# Patient Record
Sex: Female | Born: 1935 | Race: Asian | Hispanic: No | Smoking: Never smoker
Health system: Southern US, Community
[De-identification: ages and names within clinical notes are randomized; demographics above are authoritative.]

## PROBLEM LIST (undated history)

## (undated) DIAGNOSIS — I10 Essential (primary) hypertension: Secondary | ICD-10-CM

## (undated) HISTORY — PX: APPENDECTOMY: SHX54

---

## 2012-07-09 ENCOUNTER — Other Ambulatory Visit (HOSPITAL_COMMUNITY): Payer: Self-pay | Admitting: Nurse Practitioner

## 2012-07-09 DIAGNOSIS — N289 Disorder of kidney and ureter, unspecified: Secondary | ICD-10-CM

## 2012-07-11 ENCOUNTER — Ambulatory Visit (HOSPITAL_COMMUNITY)
Admission: RE | Admit: 2012-07-11 | Discharge: 2012-07-11 | Disposition: A | Discharge status: Home / Self Care | Payer: Medicare Other | Source: Ambulatory Visit | Attending: Nurse Practitioner | Admitting: Nurse Practitioner

## 2012-07-11 DIAGNOSIS — Q619 Cystic kidney disease, unspecified: Secondary | ICD-10-CM | POA: Insufficient documentation

## 2012-07-11 DIAGNOSIS — N289 Disorder of kidney and ureter, unspecified: Secondary | ICD-10-CM

## 2012-07-11 DIAGNOSIS — N189 Chronic kidney disease, unspecified: Secondary | ICD-10-CM | POA: Insufficient documentation

## 2013-11-12 ENCOUNTER — Other Ambulatory Visit (HOSPITAL_COMMUNITY): Payer: Self-pay | Admitting: Internal Medicine

## 2013-11-12 DIAGNOSIS — Z139 Encounter for screening, unspecified: Secondary | ICD-10-CM

## 2013-11-18 ENCOUNTER — Ambulatory Visit (HOSPITAL_COMMUNITY)
Admission: RE | Admit: 2013-11-18 | Discharge: 2013-11-18 | Disposition: A | Discharge status: Home / Self Care | Payer: Medicare HMO | Source: Ambulatory Visit | Attending: Internal Medicine | Admitting: Internal Medicine

## 2013-11-18 DIAGNOSIS — Z139 Encounter for screening, unspecified: Secondary | ICD-10-CM | POA: Diagnosis not present

## 2014-02-20 ENCOUNTER — Ambulatory Visit: Payer: Self-pay | Admitting: Ophthalmology

## 2014-03-04 ENCOUNTER — Ambulatory Visit: Payer: Self-pay | Admitting: Ophthalmology

## 2014-03-25 ENCOUNTER — Ambulatory Visit: Payer: Self-pay | Admitting: Ophthalmology

## 2014-04-14 DIAGNOSIS — E875 Hyperkalemia: Secondary | ICD-10-CM | POA: Diagnosis not present

## 2014-04-14 DIAGNOSIS — D509 Iron deficiency anemia, unspecified: Secondary | ICD-10-CM | POA: Diagnosis not present

## 2014-04-30 DIAGNOSIS — Z0100 Encounter for examination of eyes and vision without abnormal findings: Secondary | ICD-10-CM | POA: Diagnosis not present

## 2014-06-23 DIAGNOSIS — H524 Presbyopia: Secondary | ICD-10-CM | POA: Diagnosis not present

## 2014-06-23 DIAGNOSIS — H5210 Myopia, unspecified eye: Secondary | ICD-10-CM | POA: Diagnosis not present

## 2014-07-26 NOTE — Op Note (Signed)
PATIENT NAME:  Phyllis Henderson, [name] MR#:  [id number] DATE OF BIRTH:  01-20-1932  DATE OF PROCEDURE:  03/04/2014  LOCATION: [facility]  PREOPERATIVE DIAGNOSIS:  Nuclear sclerotic cataract of the right eye.   POSTOPERATIVE DIAGNOSIS:  Nuclear sclerotic cataract of the right eye.   OPERATIVE PROCEDURE:  Cataract extraction by phacoemulsification with implant of intraocular lens to the right eye.   SURGEON:  Galen ManilaWilliam Kalika Smay, MD.   ANESTHESIA:  1. Managed anesthesia care.  2. Topical tetracaine drops followed by 2% Xylocaine jelly applied in the preoperative holding area.   COMPLICATIONS:  None.   TECHNIQUE:   Stop and chop.  DESCRIPTION OF PROCEDURE:  The patient was examined and consented in the preoperative holding area where the aforementioned topical anesthesia was applied to the right eye and then brought back to the Operating Room where the right eye was prepped and draped in the usual sterile ophthalmic fashion and a lid speculum was placed. A paracentesis was created with the side port blade and the anterior chamber was filled with viscoelastic. A near clear corneal incision was performed with the steel keratome. A continuous curvilinear capsulorrhexis was performed with a cystotome followed by the capsulorrhexis forceps. Hydrodissection and hydrodelineation were carried out with BSS on a blunt cannula. The lens was removed in a stop and chop technique and the remaining cortical material was removed with the irrigation-aspiration handpiece. The capsular bag was inflated with viscoelastic and the Tecnis ZCB00 21.0 diopter lens, serial number [id number] was placed in the capsular bag without complication. The remaining viscoelastic was removed from the eye with the irrigation-aspiration handpiece. The wounds were hydrated. The anterior chamber was flushed with Miostat and the eye was inflated to physiologic pressure. 0.1 mL of cefuroxime concentration 10 mg/mL was placed in the anterior chamber. The  wounds were found to be water tight. The eye was dressed with Vigamox.   The patient was given protective glasses to wear throughout the day and a shield with which to sleep tonight. The patient was also given drops with which to begin a drop regimen today and will follow-up with me in one day.    ____________________________ Jerilee FieldWilliam L. Rosario Duey, MD wlp:MT D: 03/04/2014 16:16:46 ET T: 03/04/2014 17:32:08 ET JOB#: [id number]  cc: Tom Macpherson L. Keinan Brouillet, MD, <Dictator> Jerilee FieldWILLIAM L Cyleigh Massaro MD ELECTRONICALLY SIGNED 03/05/2014 12:16

## 2014-07-26 NOTE — Op Note (Signed)
PATIENT NAME:  Phyllis Henderson, Phyllis Henderson MR#:  [id number] DATE OF BIRTH:  Jun 24, 1931  DATE OF PROCEDURE:  03/25/2014  PREOPERATIVE DIAGNOSIS:  Nuclear sclerotic cataract of the left eye.   POSTOPERATIVE DIAGNOSIS:  Nuclear sclerotic cataract of the left eye.   OPERATIVE PROCEDURE:  Cataract extraction by phacoemulsification with implant of intraocular lens to left eye.   SURGEON:  Galen ManilaWilliam Ladell Lea, MD.   ANESTHESIA:  1. Managed anesthesia care.  2. Topical tetracaine drops followed by 2% Xylocaine jelly applied in the preoperative holding area.   COMPLICATIONS:  None.   TECHNIQUE:   Stop and chop.  DESCRIPTION OF PROCEDURE:  The patient was examined and consented in the preoperative holding area where the aforementioned topical anesthesia was applied to the left eye and then brought back to the Operating Room where the left eye was prepped and draped in the usual sterile ophthalmic fashion and a lid speculum was placed. A paracentesis was created with the side port blade and the anterior chamber was filled with viscoelastic. A near clear corneal incision was performed with the steel keratome. A continuous curvilinear capsulorrhexis was performed with a cystotome followed by the capsulorrhexis forceps. Hydrodissection and hydrodelineation were carried out with BSS on a blunt cannula. The lens was removed in a stop and chop technique and the remaining cortical material was removed with the irrigation-aspiration handpiece. The capsular bag was inflated with viscoelastic and the Tecnis ZCB00 21.0-diopter lens, serial number [id number] was placed in the capsular bag without complication. The remaining viscoelastic was removed from the eye with the irrigation-aspiration handpiece. The wounds were hydrated. The anterior chamber was flushed with Miostat and the eye was inflated to physiologic pressure. 0.1 mL of cefuroxime concentration 10 mg/mL was placed in the anterior chamber. The wounds were found to be water  tight. The eye was dressed with Vigamox. The patient was given protective glasses to wear throughout the day and a shield with which to sleep tonight. The patient was also given drops with which to begin a drop regimen today and will follow-up with me in one day.     ____________________________ Jerilee FieldWilliam L. Berlynn Warsame, MD wlp:bm D: 03/25/2014 21:55:22 ET T: 03/25/2014 22:50:28 ET JOB#: [id number]  cc: Dennice Tindol L. Lareta Bruneau, MD, <Dictator> Jerilee FieldWILLIAM L Abas Leicht MD ELECTRONICALLY SIGNED 03/26/2014 14:41

## 2014-10-14 ENCOUNTER — Other Ambulatory Visit (HOSPITAL_COMMUNITY): Payer: Self-pay | Admitting: Internal Medicine

## 2014-10-14 DIAGNOSIS — E782 Mixed hyperlipidemia: Secondary | ICD-10-CM | POA: Diagnosis not present

## 2014-10-14 DIAGNOSIS — I10 Essential (primary) hypertension: Secondary | ICD-10-CM | POA: Diagnosis not present

## 2014-10-14 DIAGNOSIS — Z1231 Encounter for screening mammogram for malignant neoplasm of breast: Secondary | ICD-10-CM

## 2014-10-16 DIAGNOSIS — E782 Mixed hyperlipidemia: Secondary | ICD-10-CM | POA: Diagnosis not present

## 2014-10-16 DIAGNOSIS — M810 Age-related osteoporosis without current pathological fracture: Secondary | ICD-10-CM | POA: Diagnosis not present

## 2014-10-16 DIAGNOSIS — I10 Essential (primary) hypertension: Secondary | ICD-10-CM | POA: Diagnosis not present

## 2014-10-16 DIAGNOSIS — D509 Iron deficiency anemia, unspecified: Secondary | ICD-10-CM | POA: Diagnosis not present

## 2014-10-22 ENCOUNTER — Other Ambulatory Visit (HOSPITAL_COMMUNITY): Payer: Self-pay | Admitting: Internal Medicine

## 2014-10-22 DIAGNOSIS — M810 Age-related osteoporosis without current pathological fracture: Secondary | ICD-10-CM

## 2014-10-31 ENCOUNTER — Ambulatory Visit (HOSPITAL_COMMUNITY)
Admission: RE | Admit: 2014-10-31 | Discharge: 2014-10-31 | Disposition: A | Discharge status: Home / Self Care | Payer: Commercial Managed Care - HMO | Source: Ambulatory Visit | Attending: Internal Medicine | Admitting: Internal Medicine

## 2014-10-31 DIAGNOSIS — Z780 Asymptomatic menopausal state: Secondary | ICD-10-CM | POA: Insufficient documentation

## 2014-10-31 DIAGNOSIS — M810 Age-related osteoporosis without current pathological fracture: Secondary | ICD-10-CM

## 2014-10-31 DIAGNOSIS — M859 Disorder of bone density and structure, unspecified: Secondary | ICD-10-CM | POA: Diagnosis not present

## 2014-11-21 ENCOUNTER — Ambulatory Visit (HOSPITAL_COMMUNITY)
Admission: RE | Admit: 2014-11-21 | Discharge: 2014-11-21 | Disposition: A | Discharge status: Home / Self Care | Payer: Commercial Managed Care - HMO | Source: Ambulatory Visit | Attending: Internal Medicine | Admitting: Internal Medicine

## 2014-11-21 DIAGNOSIS — Z1231 Encounter for screening mammogram for malignant neoplasm of breast: Secondary | ICD-10-CM | POA: Insufficient documentation

## 2014-11-25 ENCOUNTER — Other Ambulatory Visit: Payer: Self-pay | Admitting: Internal Medicine

## 2014-11-25 DIAGNOSIS — R928 Other abnormal and inconclusive findings on diagnostic imaging of breast: Secondary | ICD-10-CM

## 2014-12-09 ENCOUNTER — Ambulatory Visit (HOSPITAL_COMMUNITY)
Admission: RE | Admit: 2014-12-09 | Discharge: 2014-12-09 | Disposition: A | Discharge status: Home / Self Care | Payer: Commercial Managed Care - HMO | Source: Ambulatory Visit | Attending: Internal Medicine | Admitting: Internal Medicine

## 2014-12-09 DIAGNOSIS — R928 Other abnormal and inconclusive findings on diagnostic imaging of breast: Secondary | ICD-10-CM | POA: Insufficient documentation

## 2015-03-24 DIAGNOSIS — E782 Mixed hyperlipidemia: Secondary | ICD-10-CM | POA: Diagnosis not present

## 2015-03-24 DIAGNOSIS — I10 Essential (primary) hypertension: Secondary | ICD-10-CM | POA: Diagnosis not present

## 2015-03-26 DIAGNOSIS — E782 Mixed hyperlipidemia: Secondary | ICD-10-CM | POA: Diagnosis not present

## 2015-03-26 DIAGNOSIS — I10 Essential (primary) hypertension: Secondary | ICD-10-CM | POA: Diagnosis not present

## 2015-03-26 DIAGNOSIS — D509 Iron deficiency anemia, unspecified: Secondary | ICD-10-CM | POA: Diagnosis not present

## 2015-03-26 DIAGNOSIS — M816 Localized osteoporosis [Lequesne]: Secondary | ICD-10-CM | POA: Diagnosis not present

## 2015-06-26 DIAGNOSIS — H524 Presbyopia: Secondary | ICD-10-CM | POA: Diagnosis not present

## 2015-06-26 DIAGNOSIS — H5210 Myopia, unspecified eye: Secondary | ICD-10-CM | POA: Diagnosis not present

## 2015-09-29 ENCOUNTER — Other Ambulatory Visit (HOSPITAL_COMMUNITY): Payer: Self-pay | Admitting: Internal Medicine

## 2015-09-29 DIAGNOSIS — E782 Mixed hyperlipidemia: Secondary | ICD-10-CM | POA: Diagnosis not present

## 2015-09-29 DIAGNOSIS — I10 Essential (primary) hypertension: Secondary | ICD-10-CM | POA: Diagnosis not present

## 2015-09-29 DIAGNOSIS — Z1231 Encounter for screening mammogram for malignant neoplasm of breast: Secondary | ICD-10-CM

## 2015-10-01 DIAGNOSIS — E875 Hyperkalemia: Secondary | ICD-10-CM | POA: Diagnosis not present

## 2015-10-01 DIAGNOSIS — I10 Essential (primary) hypertension: Secondary | ICD-10-CM | POA: Diagnosis not present

## 2015-10-01 DIAGNOSIS — D509 Iron deficiency anemia, unspecified: Secondary | ICD-10-CM | POA: Diagnosis not present

## 2015-10-01 DIAGNOSIS — D72829 Elevated white blood cell count, unspecified: Secondary | ICD-10-CM | POA: Diagnosis not present

## 2015-10-01 DIAGNOSIS — E782 Mixed hyperlipidemia: Secondary | ICD-10-CM | POA: Diagnosis not present

## 2015-11-27 ENCOUNTER — Ambulatory Visit (HOSPITAL_COMMUNITY)
Admission: RE | Admit: 2015-11-27 | Discharge: 2015-11-27 | Disposition: A | Discharge status: Home / Self Care | Payer: Commercial Managed Care - HMO | Source: Ambulatory Visit | Attending: Internal Medicine | Admitting: Internal Medicine

## 2015-11-27 DIAGNOSIS — Z1231 Encounter for screening mammogram for malignant neoplasm of breast: Secondary | ICD-10-CM | POA: Diagnosis not present

## 2016-03-22 DIAGNOSIS — E782 Mixed hyperlipidemia: Secondary | ICD-10-CM | POA: Diagnosis not present

## 2016-03-22 DIAGNOSIS — I10 Essential (primary) hypertension: Secondary | ICD-10-CM | POA: Diagnosis not present

## 2016-03-24 DIAGNOSIS — E875 Hyperkalemia: Secondary | ICD-10-CM | POA: Diagnosis not present

## 2016-03-24 DIAGNOSIS — E782 Mixed hyperlipidemia: Secondary | ICD-10-CM | POA: Diagnosis not present

## 2016-03-24 DIAGNOSIS — I10 Essential (primary) hypertension: Secondary | ICD-10-CM | POA: Diagnosis not present

## 2016-07-11 DIAGNOSIS — H5213 Myopia, bilateral: Secondary | ICD-10-CM | POA: Diagnosis not present

## 2016-07-11 DIAGNOSIS — H25013 Cortical age-related cataract, bilateral: Secondary | ICD-10-CM | POA: Diagnosis not present

## 2016-09-21 ENCOUNTER — Other Ambulatory Visit (HOSPITAL_COMMUNITY): Payer: Self-pay | Admitting: Internal Medicine

## 2016-09-21 DIAGNOSIS — I10 Essential (primary) hypertension: Secondary | ICD-10-CM | POA: Diagnosis not present

## 2016-09-21 DIAGNOSIS — D509 Iron deficiency anemia, unspecified: Secondary | ICD-10-CM | POA: Diagnosis not present

## 2016-09-21 DIAGNOSIS — Z1231 Encounter for screening mammogram for malignant neoplasm of breast: Secondary | ICD-10-CM

## 2016-09-23 DIAGNOSIS — R945 Abnormal results of liver function studies: Secondary | ICD-10-CM | POA: Diagnosis not present

## 2016-09-23 DIAGNOSIS — D509 Iron deficiency anemia, unspecified: Secondary | ICD-10-CM | POA: Diagnosis not present

## 2016-09-23 DIAGNOSIS — I10 Essential (primary) hypertension: Secondary | ICD-10-CM | POA: Diagnosis not present

## 2016-09-23 DIAGNOSIS — E782 Mixed hyperlipidemia: Secondary | ICD-10-CM | POA: Diagnosis not present

## 2016-09-23 DIAGNOSIS — M810 Age-related osteoporosis without current pathological fracture: Secondary | ICD-10-CM | POA: Diagnosis not present

## 2016-11-28 ENCOUNTER — Ambulatory Visit (HOSPITAL_COMMUNITY)
Admission: RE | Admit: 2016-11-28 | Discharge: 2016-11-28 | Disposition: A | Discharge status: Home / Self Care | Payer: Medicare HMO | Source: Ambulatory Visit | Attending: Internal Medicine | Admitting: Internal Medicine

## 2016-11-28 DIAGNOSIS — Z1231 Encounter for screening mammogram for malignant neoplasm of breast: Secondary | ICD-10-CM | POA: Diagnosis not present

## 2017-03-29 DIAGNOSIS — I10 Essential (primary) hypertension: Secondary | ICD-10-CM | POA: Diagnosis not present

## 2017-03-29 DIAGNOSIS — E875 Hyperkalemia: Secondary | ICD-10-CM | POA: Diagnosis not present

## 2017-03-29 DIAGNOSIS — D509 Iron deficiency anemia, unspecified: Secondary | ICD-10-CM | POA: Diagnosis not present

## 2017-03-29 DIAGNOSIS — E782 Mixed hyperlipidemia: Secondary | ICD-10-CM | POA: Diagnosis not present

## 2017-03-31 ENCOUNTER — Other Ambulatory Visit: Payer: Self-pay | Admitting: Internal Medicine

## 2017-03-31 DIAGNOSIS — E782 Mixed hyperlipidemia: Secondary | ICD-10-CM | POA: Diagnosis not present

## 2017-03-31 DIAGNOSIS — Z0001 Encounter for general adult medical examination with abnormal findings: Secondary | ICD-10-CM | POA: Diagnosis not present

## 2017-03-31 DIAGNOSIS — E875 Hyperkalemia: Secondary | ICD-10-CM | POA: Diagnosis not present

## 2017-03-31 DIAGNOSIS — Z780 Asymptomatic menopausal state: Secondary | ICD-10-CM

## 2017-03-31 DIAGNOSIS — I10 Essential (primary) hypertension: Secondary | ICD-10-CM | POA: Diagnosis not present

## 2017-03-31 DIAGNOSIS — D509 Iron deficiency anemia, unspecified: Secondary | ICD-10-CM | POA: Diagnosis not present

## 2017-03-31 DIAGNOSIS — R945 Abnormal results of liver function studies: Secondary | ICD-10-CM | POA: Diagnosis not present

## 2017-03-31 DIAGNOSIS — M810 Age-related osteoporosis without current pathological fracture: Secondary | ICD-10-CM | POA: Diagnosis not present

## 2017-04-11 ENCOUNTER — Other Ambulatory Visit: Payer: Self-pay | Admitting: Internal Medicine

## 2017-05-03 ENCOUNTER — Ambulatory Visit (HOSPITAL_COMMUNITY)
Admission: RE | Admit: 2017-05-03 | Discharge: 2017-05-03 | Disposition: A | Discharge status: Home / Self Care | Payer: Medicare HMO | Source: Ambulatory Visit | Attending: Internal Medicine | Admitting: Internal Medicine

## 2017-05-03 DIAGNOSIS — Z780 Asymptomatic menopausal state: Secondary | ICD-10-CM

## 2017-05-10 ENCOUNTER — Ambulatory Visit (HOSPITAL_COMMUNITY)
Admission: RE | Admit: 2017-05-10 | Discharge: 2017-05-10 | Disposition: A | Discharge status: Home / Self Care | Payer: Medicare HMO | Source: Ambulatory Visit | Attending: Internal Medicine | Admitting: Internal Medicine

## 2017-05-10 DIAGNOSIS — Z780 Asymptomatic menopausal state: Secondary | ICD-10-CM | POA: Insufficient documentation

## 2017-05-10 DIAGNOSIS — M85852 Other specified disorders of bone density and structure, left thigh: Secondary | ICD-10-CM | POA: Insufficient documentation

## 2017-05-10 DIAGNOSIS — M810 Age-related osteoporosis without current pathological fracture: Secondary | ICD-10-CM | POA: Insufficient documentation

## 2017-05-10 DIAGNOSIS — M8588 Other specified disorders of bone density and structure, other site: Secondary | ICD-10-CM | POA: Insufficient documentation

## 2017-05-29 DIAGNOSIS — M810 Age-related osteoporosis without current pathological fracture: Secondary | ICD-10-CM | POA: Diagnosis not present

## 2017-05-29 DIAGNOSIS — E782 Mixed hyperlipidemia: Secondary | ICD-10-CM | POA: Diagnosis not present

## 2017-05-29 DIAGNOSIS — E875 Hyperkalemia: Secondary | ICD-10-CM | POA: Diagnosis not present

## 2017-05-29 DIAGNOSIS — E785 Hyperlipidemia, unspecified: Secondary | ICD-10-CM | POA: Diagnosis not present

## 2017-05-29 DIAGNOSIS — I10 Essential (primary) hypertension: Secondary | ICD-10-CM | POA: Diagnosis not present

## 2017-05-29 DIAGNOSIS — D509 Iron deficiency anemia, unspecified: Secondary | ICD-10-CM | POA: Diagnosis not present

## 2017-05-29 DIAGNOSIS — R945 Abnormal results of liver function studies: Secondary | ICD-10-CM | POA: Diagnosis not present

## 2017-05-29 DIAGNOSIS — Z0001 Encounter for general adult medical examination with abnormal findings: Secondary | ICD-10-CM | POA: Diagnosis not present

## 2017-06-15 ENCOUNTER — Encounter (HOSPITAL_COMMUNITY): Payer: Self-pay

## 2017-06-15 ENCOUNTER — Encounter (HOSPITAL_COMMUNITY)
Admission: RE | Admit: 2017-06-15 | Discharge: 2017-06-15 | Disposition: A | Discharge status: Home / Self Care | Payer: Medicare HMO | Source: Ambulatory Visit | Attending: Internal Medicine | Admitting: Internal Medicine

## 2017-06-15 DIAGNOSIS — M818 Other osteoporosis without current pathological fracture: Secondary | ICD-10-CM | POA: Diagnosis not present

## 2017-06-15 HISTORY — DX: Essential (primary) hypertension: I10

## 2017-06-15 MED ORDER — DENOSUMAB 60 MG/ML ~~LOC~~ SOLN
60.0000 mg | Freq: Once | SUBCUTANEOUS | Status: AC
  Administered 2017-06-15: 60 mg via SUBCUTANEOUS
  Filled 2017-06-15: qty 1

## 2017-06-15 NOTE — Discharge Instructions (Signed)
Denosumab injection °What is this medicine? °DENOSUMAB (den oh sue mab) slows bone breakdown. Prolia is used to treat osteoporosis in women after menopause and in men. Xgeva is used to treat a high calcium level due to cancer and to prevent bone fractures and other bone problems caused by multiple myeloma or cancer bone metastases. Xgeva is also used to treat giant cell tumor of the bone. °This medicine may be used for other purposes; ask your health care provider or pharmacist if you have questions. °COMMON BRAND NAME(S): Prolia, XGEVA °What should I tell my health care provider before I take this medicine? °They need to know if you have any of these conditions: °-dental disease °-having surgery or tooth extraction °-infection °-kidney disease °-low levels of calcium or Vitamin D in the blood °-malnutrition °-on hemodialysis °-skin conditions or sensitivity °-thyroid or parathyroid disease °-an unusual reaction to denosumab, other medicines, foods, dyes, or preservatives °-pregnant or trying to get pregnant °-breast-feeding °How should I use this medicine? °This medicine is for injection under the skin. It is given by a health care professional in a hospital or clinic setting. °If you are getting Prolia, a special MedGuide will be given to you by the pharmacist with each prescription and refill. Be sure to read this information carefully each time. °For Prolia, talk to your pediatrician regarding the use of this medicine in children. Special care may be needed. For Xgeva, talk to your pediatrician regarding the use of this medicine in children. While this drug may be prescribed for children as young as 13 years for selected conditions, precautions do apply. °Overdosage: If you think you have taken too much of this medicine contact a poison control center or emergency room at once. °NOTE: This medicine is only for you. Do not share this medicine with others. °What if I miss a dose? °It is important not to miss your  dose. Call your doctor or health care professional if you are unable to keep an appointment. °What may interact with this medicine? °Do not take this medicine with any of the following medications: °-other medicines containing denosumab °This medicine may also interact with the following medications: °-medicines that lower your chance of fighting infection °-steroid medicines like prednisone or cortisone °This list may not describe all possible interactions. Give your health care provider a list of all the medicines, herbs, non-prescription drugs, or dietary supplements you use. Also tell them if you smoke, drink alcohol, or use illegal drugs. Some items may interact with your medicine. °What should I watch for while using this medicine? °Visit your doctor or health care professional for regular checks on your progress. Your doctor or health care professional may order blood tests and other tests to see how you are doing. °Call your doctor or health care professional for advice if you get a fever, chills or sore throat, or other symptoms of a cold or flu. Do not treat yourself. This drug may decrease your body's ability to fight infection. Try to avoid being around people who are sick. °You should make sure you get enough calcium and vitamin D while you are taking this medicine, unless your doctor tells you not to. Discuss the foods you eat and the vitamins you take with your health care professional. °See your dentist regularly. Brush and floss your teeth as directed. Before you have any dental work done, tell your dentist you are receiving this medicine. °Do not become pregnant while taking this medicine or for 5 months after stopping   it. Talk with your doctor or health care professional about your birth control options while taking this medicine. Women should inform their doctor if they wish to become pregnant or think they might be pregnant. There is a potential for serious side effects to an unborn child. Talk  to your health care professional or pharmacist for more information. What side effects may I notice from receiving this medicine? Side effects that you should report to your doctor or health care professional as soon as possible: -allergic reactions like skin rash, itching or hives, swelling of the face, lips, or tongue -bone pain -breathing problems -dizziness -jaw pain, especially after dental work -redness, blistering, peeling of the skin -signs and symptoms of infection like fever or chills; cough; sore throat; pain or trouble passing urine -signs of low calcium like fast heartbeat, muscle cramps or muscle pain; pain, tingling, numbness in the hands or feet; seizures -unusual bleeding or bruising -unusually weak or tired Side effects that usually do not require medical attention (report to your doctor or health care professional if they continue or are bothersome): -constipation -diarrhea -headache -joint pain -loss of appetite -muscle pain -runny nose -tiredness -upset stomach This list may not describe all possible side effects. Call your doctor for medical advice about side effects. You may report side effects to FDA at 1-800-FDA-1088. Where should I keep my medicine? This medicine is only given in a clinic, doctor's office, or other health care setting and will not be stored at home. NOTE: This sheet is a summary. It may not cover all possible information. If you have questions about this medicine, talk to your doctor, pharmacist, or health care provider.  2018 [employer] (2016-04-12 19:17:21)

## 2017-07-12 DIAGNOSIS — Z961 Presence of intraocular lens: Secondary | ICD-10-CM | POA: Diagnosis not present

## 2017-07-12 DIAGNOSIS — H524 Presbyopia: Secondary | ICD-10-CM | POA: Diagnosis not present

## 2017-09-25 DIAGNOSIS — E782 Mixed hyperlipidemia: Secondary | ICD-10-CM | POA: Diagnosis not present

## 2017-09-29 ENCOUNTER — Other Ambulatory Visit (HOSPITAL_COMMUNITY): Payer: Self-pay | Admitting: Internal Medicine

## 2017-09-29 DIAGNOSIS — Z1231 Encounter for screening mammogram for malignant neoplasm of breast: Secondary | ICD-10-CM

## 2017-09-29 DIAGNOSIS — R945 Abnormal results of liver function studies: Secondary | ICD-10-CM | POA: Diagnosis not present

## 2017-09-29 DIAGNOSIS — D509 Iron deficiency anemia, unspecified: Secondary | ICD-10-CM | POA: Diagnosis not present

## 2017-09-29 DIAGNOSIS — I10 Essential (primary) hypertension: Secondary | ICD-10-CM | POA: Diagnosis not present

## 2017-09-29 DIAGNOSIS — E782 Mixed hyperlipidemia: Secondary | ICD-10-CM | POA: Diagnosis not present

## 2017-09-29 DIAGNOSIS — M810 Age-related osteoporosis without current pathological fracture: Secondary | ICD-10-CM | POA: Diagnosis not present

## 2017-09-29 DIAGNOSIS — E875 Hyperkalemia: Secondary | ICD-10-CM | POA: Diagnosis not present

## 2017-09-29 DIAGNOSIS — Z681 Body mass index (BMI) 19 or less, adult: Secondary | ICD-10-CM | POA: Diagnosis not present

## 2017-11-30 ENCOUNTER — Ambulatory Visit (HOSPITAL_COMMUNITY)
Admission: RE | Admit: 2017-11-30 | Discharge: 2017-11-30 | Disposition: A | Discharge status: Home / Self Care | Payer: Medicare HMO | Source: Ambulatory Visit | Attending: Internal Medicine | Admitting: Internal Medicine

## 2017-11-30 ENCOUNTER — Encounter (HOSPITAL_COMMUNITY): Payer: Self-pay

## 2017-11-30 DIAGNOSIS — Z1231 Encounter for screening mammogram for malignant neoplasm of breast: Secondary | ICD-10-CM | POA: Insufficient documentation

## 2017-12-18 ENCOUNTER — Encounter (HOSPITAL_COMMUNITY): Payer: Medicare HMO

## 2017-12-18 ENCOUNTER — Encounter (HOSPITAL_COMMUNITY): Admission: RE | Admit: 2017-12-18 | Payer: Medicare HMO | Source: Ambulatory Visit

## 2018-03-22 DIAGNOSIS — E785 Hyperlipidemia, unspecified: Secondary | ICD-10-CM | POA: Diagnosis not present

## 2018-03-22 DIAGNOSIS — I10 Essential (primary) hypertension: Secondary | ICD-10-CM | POA: Diagnosis not present

## 2018-03-22 DIAGNOSIS — D509 Iron deficiency anemia, unspecified: Secondary | ICD-10-CM | POA: Diagnosis not present

## 2018-03-22 DIAGNOSIS — R945 Abnormal results of liver function studies: Secondary | ICD-10-CM | POA: Diagnosis not present

## 2018-03-22 DIAGNOSIS — E782 Mixed hyperlipidemia: Secondary | ICD-10-CM | POA: Diagnosis not present

## 2018-03-22 DIAGNOSIS — M810 Age-related osteoporosis without current pathological fracture: Secondary | ICD-10-CM | POA: Diagnosis not present

## 2018-03-26 DIAGNOSIS — D509 Iron deficiency anemia, unspecified: Secondary | ICD-10-CM | POA: Diagnosis not present

## 2018-03-26 DIAGNOSIS — M810 Age-related osteoporosis without current pathological fracture: Secondary | ICD-10-CM | POA: Diagnosis not present

## 2018-03-26 DIAGNOSIS — M546 Pain in thoracic spine: Secondary | ICD-10-CM | POA: Diagnosis not present

## 2018-03-26 DIAGNOSIS — R945 Abnormal results of liver function studies: Secondary | ICD-10-CM | POA: Diagnosis not present

## 2018-03-26 DIAGNOSIS — Z0001 Encounter for general adult medical examination with abnormal findings: Secondary | ICD-10-CM | POA: Diagnosis not present

## 2018-03-26 DIAGNOSIS — E875 Hyperkalemia: Secondary | ICD-10-CM | POA: Diagnosis not present

## 2018-03-26 DIAGNOSIS — I10 Essential (primary) hypertension: Secondary | ICD-10-CM | POA: Diagnosis not present

## 2018-04-23 DIAGNOSIS — D509 Iron deficiency anemia, unspecified: Secondary | ICD-10-CM | POA: Diagnosis not present

## 2018-04-23 DIAGNOSIS — Z0001 Encounter for general adult medical examination with abnormal findings: Secondary | ICD-10-CM | POA: Diagnosis not present

## 2018-04-23 DIAGNOSIS — Z681 Body mass index (BMI) 19 or less, adult: Secondary | ICD-10-CM | POA: Diagnosis not present

## 2018-04-23 DIAGNOSIS — E782 Mixed hyperlipidemia: Secondary | ICD-10-CM | POA: Diagnosis not present

## 2018-04-23 DIAGNOSIS — E875 Hyperkalemia: Secondary | ICD-10-CM | POA: Diagnosis not present

## 2018-04-23 DIAGNOSIS — I10 Essential (primary) hypertension: Secondary | ICD-10-CM | POA: Diagnosis not present

## 2018-04-23 DIAGNOSIS — E785 Hyperlipidemia, unspecified: Secondary | ICD-10-CM | POA: Diagnosis not present

## 2018-04-23 DIAGNOSIS — R945 Abnormal results of liver function studies: Secondary | ICD-10-CM | POA: Diagnosis not present

## 2018-04-23 DIAGNOSIS — M816 Localized osteoporosis [Lequesne]: Secondary | ICD-10-CM | POA: Diagnosis not present

## 2018-10-26 DIAGNOSIS — E875 Hyperkalemia: Secondary | ICD-10-CM | POA: Diagnosis not present

## 2018-10-26 DIAGNOSIS — I10 Essential (primary) hypertension: Secondary | ICD-10-CM | POA: Diagnosis not present

## 2018-10-26 DIAGNOSIS — D509 Iron deficiency anemia, unspecified: Secondary | ICD-10-CM | POA: Diagnosis not present

## 2018-10-26 DIAGNOSIS — E785 Hyperlipidemia, unspecified: Secondary | ICD-10-CM | POA: Diagnosis not present

## 2018-10-26 DIAGNOSIS — E782 Mixed hyperlipidemia: Secondary | ICD-10-CM | POA: Diagnosis not present

## 2018-10-30 DIAGNOSIS — I10 Essential (primary) hypertension: Secondary | ICD-10-CM | POA: Diagnosis not present

## 2018-10-30 DIAGNOSIS — E875 Hyperkalemia: Secondary | ICD-10-CM | POA: Diagnosis not present

## 2018-10-30 DIAGNOSIS — R945 Abnormal results of liver function studies: Secondary | ICD-10-CM | POA: Diagnosis not present

## 2018-10-30 DIAGNOSIS — M810 Age-related osteoporosis without current pathological fracture: Secondary | ICD-10-CM | POA: Diagnosis not present

## 2018-10-30 DIAGNOSIS — E782 Mixed hyperlipidemia: Secondary | ICD-10-CM | POA: Diagnosis not present

## 2018-10-30 DIAGNOSIS — Z0001 Encounter for general adult medical examination with abnormal findings: Secondary | ICD-10-CM | POA: Diagnosis not present

## 2018-10-30 DIAGNOSIS — M546 Pain in thoracic spine: Secondary | ICD-10-CM | POA: Diagnosis not present

## 2018-10-30 DIAGNOSIS — D509 Iron deficiency anemia, unspecified: Secondary | ICD-10-CM | POA: Diagnosis not present

## 2018-11-07 ENCOUNTER — Other Ambulatory Visit (HOSPITAL_COMMUNITY): Payer: Self-pay | Admitting: Internal Medicine

## 2018-11-07 DIAGNOSIS — Z1231 Encounter for screening mammogram for malignant neoplasm of breast: Secondary | ICD-10-CM

## 2018-12-17 ENCOUNTER — Ambulatory Visit (HOSPITAL_COMMUNITY)
Admission: RE | Admit: 2018-12-17 | Discharge: 2018-12-17 | Disposition: A | Discharge status: Home / Self Care | Payer: Medicare HMO | Source: Ambulatory Visit | Attending: Internal Medicine | Admitting: Internal Medicine

## 2018-12-17 ENCOUNTER — Other Ambulatory Visit: Payer: Self-pay

## 2018-12-17 DIAGNOSIS — Z1231 Encounter for screening mammogram for malignant neoplasm of breast: Secondary | ICD-10-CM | POA: Diagnosis not present

## 2019-01-29 DIAGNOSIS — I10 Essential (primary) hypertension: Secondary | ICD-10-CM | POA: Diagnosis not present

## 2019-01-29 DIAGNOSIS — R945 Abnormal results of liver function studies: Secondary | ICD-10-CM | POA: Diagnosis not present

## 2019-01-29 DIAGNOSIS — E875 Hyperkalemia: Secondary | ICD-10-CM | POA: Diagnosis not present

## 2019-01-29 DIAGNOSIS — D509 Iron deficiency anemia, unspecified: Secondary | ICD-10-CM | POA: Diagnosis not present

## 2019-01-29 DIAGNOSIS — E782 Mixed hyperlipidemia: Secondary | ICD-10-CM | POA: Diagnosis not present

## 2019-03-07 DIAGNOSIS — R945 Abnormal results of liver function studies: Secondary | ICD-10-CM | POA: Diagnosis not present

## 2019-03-07 DIAGNOSIS — E875 Hyperkalemia: Secondary | ICD-10-CM | POA: Diagnosis not present

## 2019-03-07 DIAGNOSIS — E7849 Other hyperlipidemia: Secondary | ICD-10-CM | POA: Diagnosis not present

## 2019-03-07 DIAGNOSIS — I10 Essential (primary) hypertension: Secondary | ICD-10-CM | POA: Diagnosis not present

## 2019-03-07 DIAGNOSIS — D509 Iron deficiency anemia, unspecified: Secondary | ICD-10-CM | POA: Diagnosis not present

## 2019-04-23 DIAGNOSIS — E782 Mixed hyperlipidemia: Secondary | ICD-10-CM | POA: Diagnosis not present

## 2019-04-23 DIAGNOSIS — E785 Hyperlipidemia, unspecified: Secondary | ICD-10-CM | POA: Diagnosis not present

## 2019-04-23 DIAGNOSIS — I10 Essential (primary) hypertension: Secondary | ICD-10-CM | POA: Diagnosis not present

## 2019-04-29 DIAGNOSIS — E782 Mixed hyperlipidemia: Secondary | ICD-10-CM | POA: Diagnosis not present

## 2019-04-29 DIAGNOSIS — M25561 Pain in right knee: Secondary | ICD-10-CM | POA: Diagnosis not present

## 2019-04-29 DIAGNOSIS — M810 Age-related osteoporosis without current pathological fracture: Secondary | ICD-10-CM | POA: Diagnosis not present

## 2019-04-29 DIAGNOSIS — E875 Hyperkalemia: Secondary | ICD-10-CM | POA: Diagnosis not present

## 2019-04-29 DIAGNOSIS — D509 Iron deficiency anemia, unspecified: Secondary | ICD-10-CM | POA: Diagnosis not present

## 2019-04-29 DIAGNOSIS — I10 Essential (primary) hypertension: Secondary | ICD-10-CM | POA: Diagnosis not present

## 2019-04-29 DIAGNOSIS — R945 Abnormal results of liver function studies: Secondary | ICD-10-CM | POA: Diagnosis not present

## 2019-05-08 ENCOUNTER — Other Ambulatory Visit (HOSPITAL_COMMUNITY): Payer: Self-pay | Admitting: Internal Medicine

## 2019-05-08 DIAGNOSIS — M8580 Other specified disorders of bone density and structure, unspecified site: Secondary | ICD-10-CM

## 2019-08-05 DIAGNOSIS — R945 Abnormal results of liver function studies: Secondary | ICD-10-CM | POA: Diagnosis not present

## 2019-08-05 DIAGNOSIS — E782 Mixed hyperlipidemia: Secondary | ICD-10-CM | POA: Diagnosis not present

## 2019-08-05 DIAGNOSIS — E875 Hyperkalemia: Secondary | ICD-10-CM | POA: Diagnosis not present

## 2019-08-05 DIAGNOSIS — I10 Essential (primary) hypertension: Secondary | ICD-10-CM | POA: Diagnosis not present

## 2019-08-05 DIAGNOSIS — D509 Iron deficiency anemia, unspecified: Secondary | ICD-10-CM | POA: Diagnosis not present

## 2019-10-17 DIAGNOSIS — H524 Presbyopia: Secondary | ICD-10-CM | POA: Diagnosis not present

## 2019-10-22 DIAGNOSIS — Z712 Person consulting for explanation of examination or test findings: Secondary | ICD-10-CM | POA: Diagnosis not present

## 2019-10-22 DIAGNOSIS — E782 Mixed hyperlipidemia: Secondary | ICD-10-CM | POA: Diagnosis not present

## 2019-10-22 DIAGNOSIS — E875 Hyperkalemia: Secondary | ICD-10-CM | POA: Diagnosis not present

## 2019-10-22 DIAGNOSIS — M816 Localized osteoporosis [Lequesne]: Secondary | ICD-10-CM | POA: Diagnosis not present

## 2019-10-22 DIAGNOSIS — M25561 Pain in right knee: Secondary | ICD-10-CM | POA: Diagnosis not present

## 2019-10-22 DIAGNOSIS — Z0001 Encounter for general adult medical examination with abnormal findings: Secondary | ICD-10-CM | POA: Diagnosis not present

## 2019-10-22 DIAGNOSIS — D509 Iron deficiency anemia, unspecified: Secondary | ICD-10-CM | POA: Diagnosis not present

## 2019-10-22 DIAGNOSIS — I10 Essential (primary) hypertension: Secondary | ICD-10-CM | POA: Diagnosis not present

## 2019-10-22 DIAGNOSIS — E785 Hyperlipidemia, unspecified: Secondary | ICD-10-CM | POA: Diagnosis not present

## 2019-10-22 DIAGNOSIS — Z681 Body mass index (BMI) 19 or less, adult: Secondary | ICD-10-CM | POA: Diagnosis not present

## 2019-10-22 DIAGNOSIS — M546 Pain in thoracic spine: Secondary | ICD-10-CM | POA: Diagnosis not present

## 2019-10-28 DIAGNOSIS — R945 Abnormal results of liver function studies: Secondary | ICD-10-CM | POA: Diagnosis not present

## 2019-10-28 DIAGNOSIS — M25561 Pain in right knee: Secondary | ICD-10-CM | POA: Diagnosis not present

## 2019-10-28 DIAGNOSIS — E782 Mixed hyperlipidemia: Secondary | ICD-10-CM | POA: Diagnosis not present

## 2019-10-28 DIAGNOSIS — Z0001 Encounter for general adult medical examination with abnormal findings: Secondary | ICD-10-CM | POA: Diagnosis not present

## 2019-10-28 DIAGNOSIS — D509 Iron deficiency anemia, unspecified: Secondary | ICD-10-CM | POA: Diagnosis not present

## 2019-10-28 DIAGNOSIS — I10 Essential (primary) hypertension: Secondary | ICD-10-CM | POA: Diagnosis not present

## 2019-10-28 DIAGNOSIS — M810 Age-related osteoporosis without current pathological fracture: Secondary | ICD-10-CM | POA: Diagnosis not present

## 2019-10-28 DIAGNOSIS — M545 Low back pain: Secondary | ICD-10-CM | POA: Diagnosis not present

## 2019-10-28 DIAGNOSIS — E875 Hyperkalemia: Secondary | ICD-10-CM | POA: Diagnosis not present

## 2019-11-07 ENCOUNTER — Other Ambulatory Visit (HOSPITAL_COMMUNITY): Payer: Self-pay | Admitting: Internal Medicine

## 2019-11-07 DIAGNOSIS — Z1231 Encounter for screening mammogram for malignant neoplasm of breast: Secondary | ICD-10-CM

## 2019-11-26 DIAGNOSIS — D509 Iron deficiency anemia, unspecified: Secondary | ICD-10-CM | POA: Diagnosis not present

## 2019-11-26 DIAGNOSIS — R945 Abnormal results of liver function studies: Secondary | ICD-10-CM | POA: Diagnosis not present

## 2019-11-26 DIAGNOSIS — E875 Hyperkalemia: Secondary | ICD-10-CM | POA: Diagnosis not present

## 2019-11-26 DIAGNOSIS — M25561 Pain in right knee: Secondary | ICD-10-CM | POA: Diagnosis not present

## 2019-11-26 DIAGNOSIS — E440 Moderate protein-calorie malnutrition: Secondary | ICD-10-CM | POA: Diagnosis not present

## 2019-11-26 DIAGNOSIS — I10 Essential (primary) hypertension: Secondary | ICD-10-CM | POA: Diagnosis not present

## 2019-11-26 DIAGNOSIS — M545 Low back pain: Secondary | ICD-10-CM | POA: Diagnosis not present

## 2019-11-26 DIAGNOSIS — E782 Mixed hyperlipidemia: Secondary | ICD-10-CM | POA: Diagnosis not present

## 2019-11-26 DIAGNOSIS — M810 Age-related osteoporosis without current pathological fracture: Secondary | ICD-10-CM | POA: Diagnosis not present

## 2019-12-23 ENCOUNTER — Ambulatory Visit (HOSPITAL_COMMUNITY)
Admission: RE | Admit: 2019-12-23 | Discharge: 2019-12-23 | Disposition: A | Discharge status: Home / Self Care | Payer: Medicare HMO | Source: Ambulatory Visit | Attending: Internal Medicine | Admitting: Internal Medicine

## 2019-12-23 ENCOUNTER — Other Ambulatory Visit: Payer: Self-pay

## 2019-12-23 DIAGNOSIS — Z1231 Encounter for screening mammogram for malignant neoplasm of breast: Secondary | ICD-10-CM | POA: Insufficient documentation

## 2019-12-26 DIAGNOSIS — E440 Moderate protein-calorie malnutrition: Secondary | ICD-10-CM | POA: Diagnosis not present

## 2019-12-26 DIAGNOSIS — M545 Low back pain: Secondary | ICD-10-CM | POA: Diagnosis not present

## 2019-12-26 DIAGNOSIS — E782 Mixed hyperlipidemia: Secondary | ICD-10-CM | POA: Diagnosis not present

## 2019-12-26 DIAGNOSIS — I10 Essential (primary) hypertension: Secondary | ICD-10-CM | POA: Diagnosis not present

## 2019-12-26 DIAGNOSIS — D509 Iron deficiency anemia, unspecified: Secondary | ICD-10-CM | POA: Diagnosis not present

## 2019-12-26 DIAGNOSIS — R945 Abnormal results of liver function studies: Secondary | ICD-10-CM | POA: Diagnosis not present

## 2019-12-26 DIAGNOSIS — M25561 Pain in right knee: Secondary | ICD-10-CM | POA: Diagnosis not present

## 2019-12-26 DIAGNOSIS — M810 Age-related osteoporosis without current pathological fracture: Secondary | ICD-10-CM | POA: Diagnosis not present

## 2019-12-26 DIAGNOSIS — E875 Hyperkalemia: Secondary | ICD-10-CM | POA: Diagnosis not present

## 2020-06-01 DIAGNOSIS — E782 Mixed hyperlipidemia: Secondary | ICD-10-CM | POA: Diagnosis not present

## 2020-06-01 DIAGNOSIS — D509 Iron deficiency anemia, unspecified: Secondary | ICD-10-CM | POA: Diagnosis not present

## 2020-06-01 DIAGNOSIS — I10 Essential (primary) hypertension: Secondary | ICD-10-CM | POA: Diagnosis not present

## 2020-06-01 DIAGNOSIS — E875 Hyperkalemia: Secondary | ICD-10-CM | POA: Diagnosis not present

## 2021-07-21 IMAGING — MG MM DIGITAL SCREENING BILAT W/ TOMO W/ CAD
8 series · 9 of 24 positions shown · non-contrast
Comparison: Previous exam(s).

CLINICAL DATA: Screening.

EXAM:
DIGITAL SCREENING BILATERAL MAMMOGRAM WITH TOMO AND CAD

[R CC synth-2D]
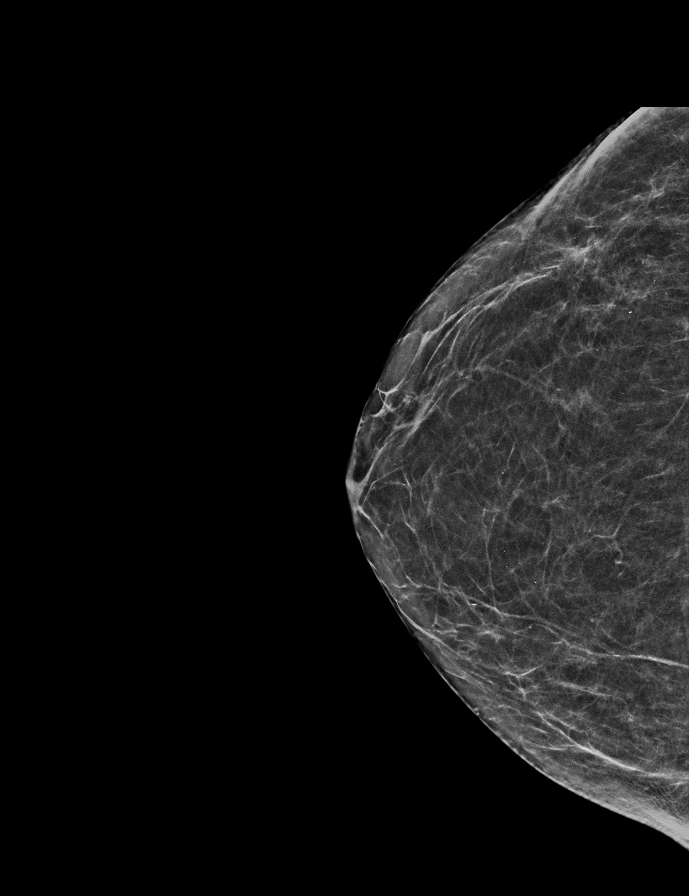

[L MLO synth-2D]
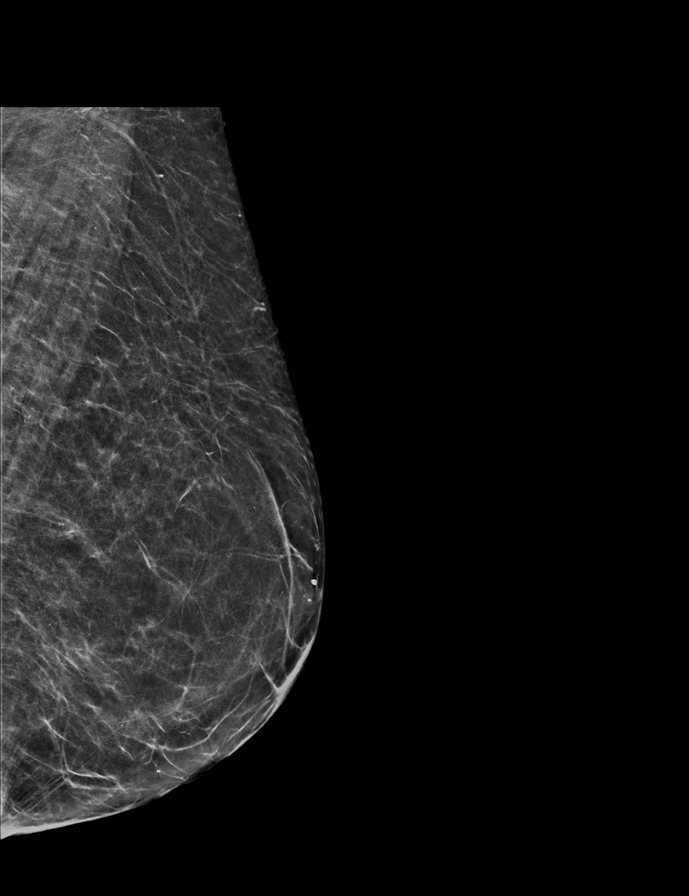

[R MLO synth-2D]
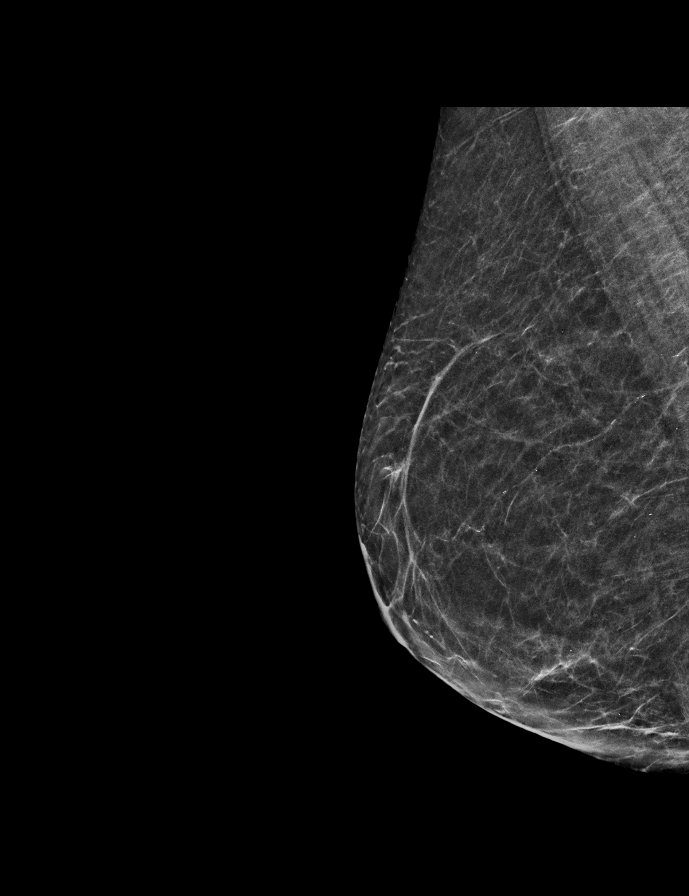

[L CC synth-2D]
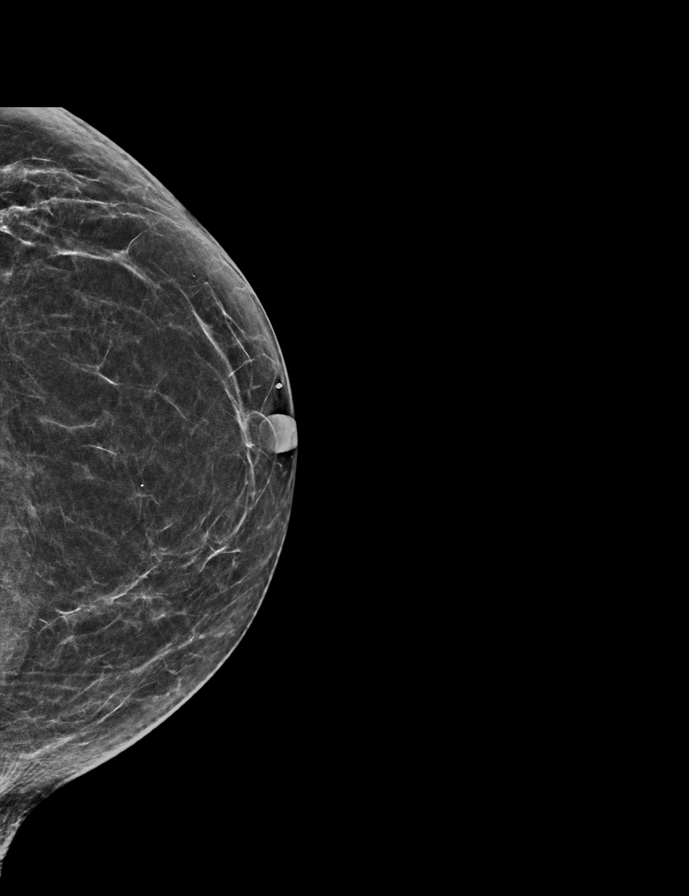

[R MLO tomo · 2 of 47 frames shown]
[frame 16/47]
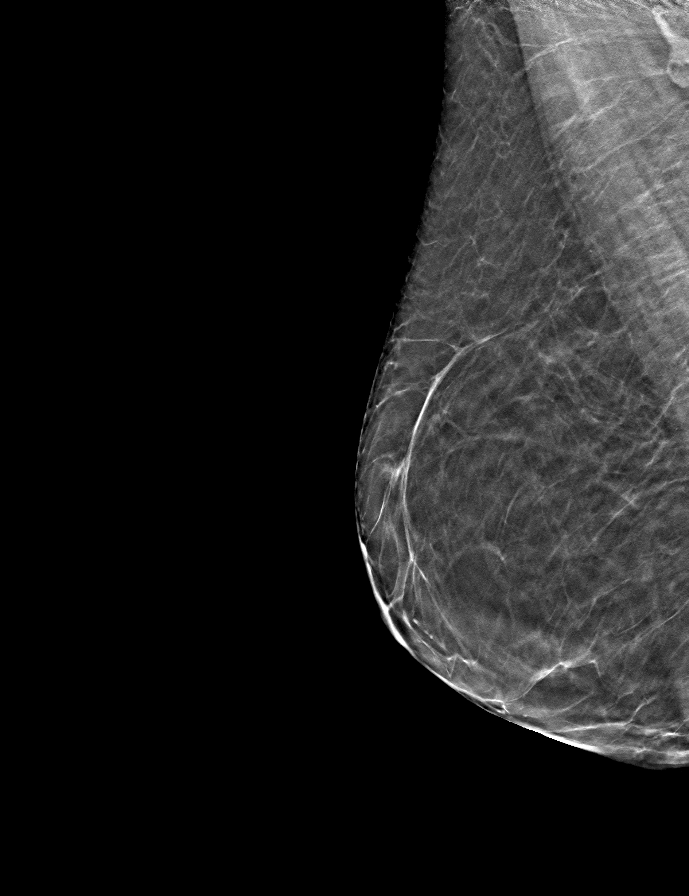
[frame 24/47]
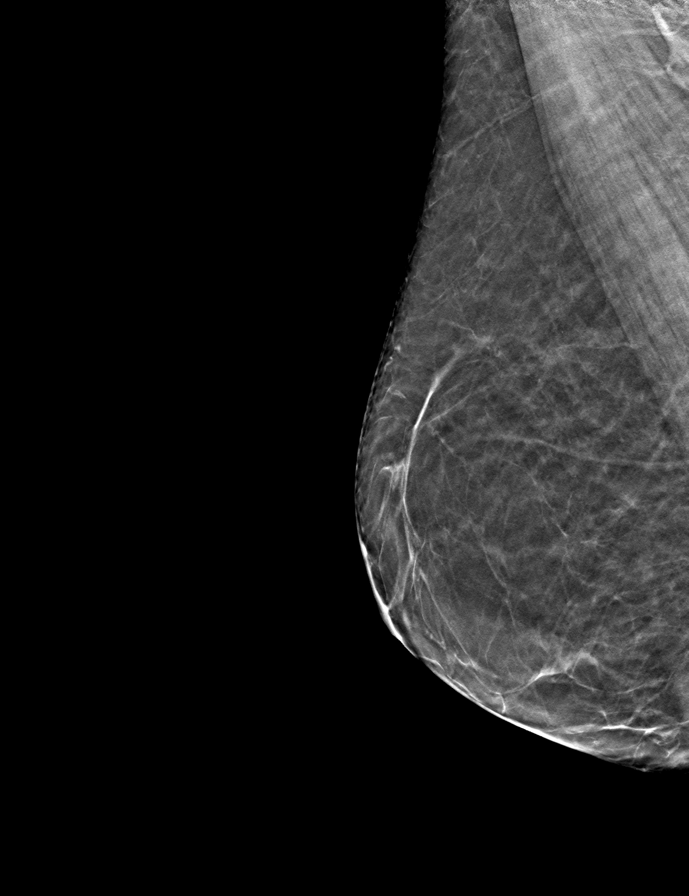

[L MLO tomo · tomo slice 25/49.0]
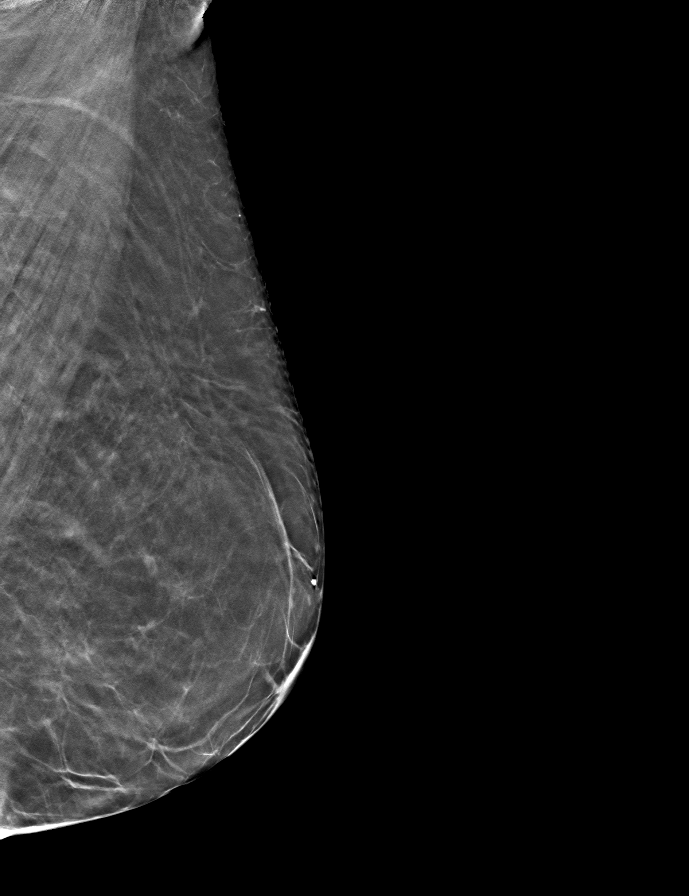

[L CC tomo · tomo slice 26/51.0]
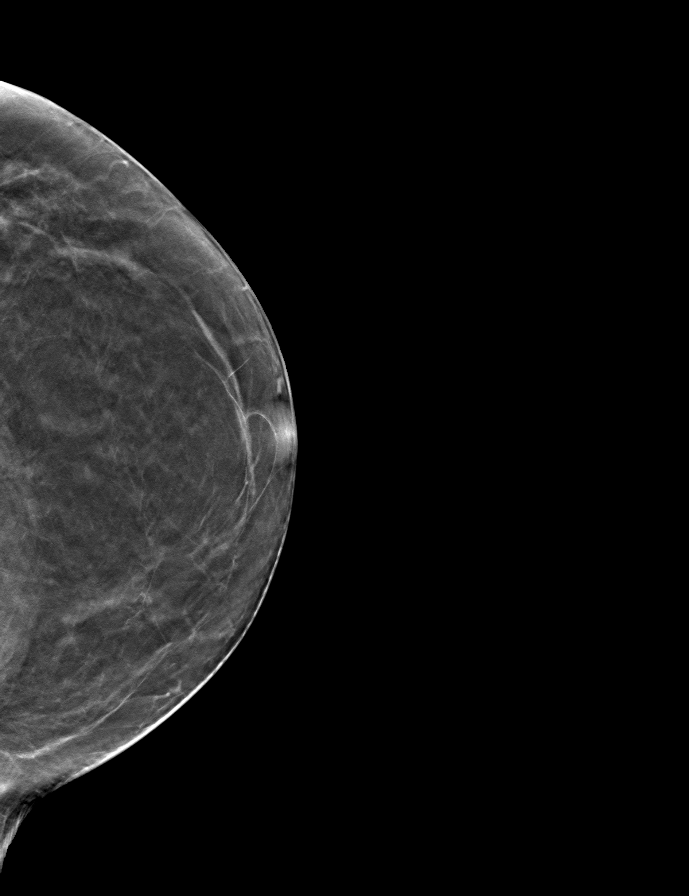

[R CC tomo · tomo slice 25/48.0]
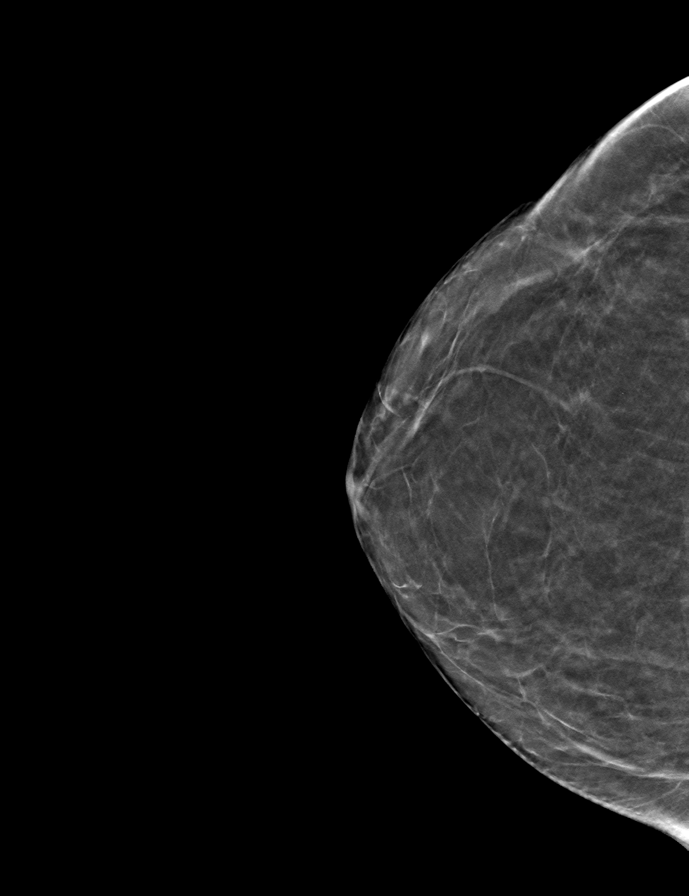

[9 of 24 positions shown; findings below may reference images not displayed]

ACR Breast Density Category b: There are scattered areas of
fibroglandular density.
FINDINGS: There are no findings suspicious for malignancy. Images were
processed with CAD.
IMPRESSION: No mammographic evidence of malignancy. A result letter of this
screening mammogram will be mailed directly to the patient.

RECOMMENDATION:
Screening mammogram in one year. (Code:[id number])

BI-RADS CATEGORY  1: Negative.
